# Patient Record
Sex: Female | Born: 1960 | Race: Black or African American | Hispanic: No | Smoking: Current every day smoker
Health system: Southern US, Community
[De-identification: ages and names within clinical notes are randomized; demographics above are authoritative.]

## PROBLEM LIST (undated history)

## (undated) DIAGNOSIS — M7660 Achilles tendinitis, unspecified leg: Secondary | ICD-10-CM

## (undated) DIAGNOSIS — F329 Major depressive disorder, single episode, unspecified: Secondary | ICD-10-CM

## (undated) DIAGNOSIS — B009 Herpesviral infection, unspecified: Secondary | ICD-10-CM

## (undated) DIAGNOSIS — I10 Essential (primary) hypertension: Secondary | ICD-10-CM

## (undated) DIAGNOSIS — M7580 Other shoulder lesions, unspecified shoulder: Secondary | ICD-10-CM

## (undated) DIAGNOSIS — S43006A Unspecified dislocation of unspecified shoulder joint, initial encounter: Secondary | ICD-10-CM

## (undated) DIAGNOSIS — M19019 Primary osteoarthritis, unspecified shoulder: Secondary | ICD-10-CM

## (undated) DIAGNOSIS — K219 Gastro-esophageal reflux disease without esophagitis: Secondary | ICD-10-CM

## (undated) DIAGNOSIS — M109 Gout, unspecified: Secondary | ICD-10-CM

## (undated) DIAGNOSIS — F339 Major depressive disorder, recurrent, unspecified: Secondary | ICD-10-CM

## (undated) DIAGNOSIS — E781 Pure hyperglyceridemia: Secondary | ICD-10-CM

## (undated) DIAGNOSIS — R197 Diarrhea, unspecified: Secondary | ICD-10-CM

## (undated) DIAGNOSIS — M25819 Other specified joint disorders, unspecified shoulder: Secondary | ICD-10-CM

## (undated) DIAGNOSIS — M722 Plantar fascial fibromatosis: Secondary | ICD-10-CM

## (undated) DIAGNOSIS — F3289 Other specified depressive episodes: Secondary | ICD-10-CM

## (undated) DIAGNOSIS — R195 Other fecal abnormalities: Secondary | ICD-10-CM

## (undated) DIAGNOSIS — G5600 Carpal tunnel syndrome, unspecified upper limb: Secondary | ICD-10-CM

## (undated) DIAGNOSIS — M66879 Spontaneous rupture of other tendons, unspecified ankle and foot: Secondary | ICD-10-CM

## (undated) HISTORY — DX: Primary osteoarthritis, unspecified shoulder: M19.019

## (undated) HISTORY — DX: Gout, unspecified: M10.9

## (undated) HISTORY — DX: Major depressive disorder, recurrent, unspecified: F33.9

## (undated) HISTORY — PX: ECTOPIC PREGNANCY SURGERY: SHX613

## (undated) HISTORY — DX: Carpal tunnel syndrome, unspecified upper limb: G56.00

## (undated) HISTORY — PX: OTHER SURGICAL HISTORY: SHX169

## (undated) HISTORY — DX: Plantar fascial fibromatosis: M72.2

## (undated) HISTORY — DX: Other fecal abnormalities: R19.5

## (undated) HISTORY — DX: Achilles tendinitis, unspecified leg: M76.60

## (undated) HISTORY — DX: Diarrhea, unspecified: R19.7

## (undated) HISTORY — DX: Other specified joint disorders, unspecified shoulder: M25.819

## (undated) HISTORY — DX: Unspecified dislocation of unspecified shoulder joint, initial encounter: S43.006A

## (undated) HISTORY — DX: Herpesviral infection, unspecified: B00.9

## (undated) HISTORY — DX: Gastro-esophageal reflux disease without esophagitis: K21.9

## (undated) HISTORY — DX: Spontaneous rupture of other tendons, unspecified ankle and foot: M66.879

## (undated) HISTORY — DX: Other shoulder lesions, unspecified shoulder: M75.80

## (undated) HISTORY — DX: Essential (primary) hypertension: I10

## (undated) HISTORY — DX: Major depressive disorder, single episode, unspecified: F32.9

## (undated) HISTORY — DX: Pure hyperglyceridemia: E78.1

## (undated) HISTORY — DX: Other specified depressive episodes: F32.89

---

## 1997-10-26 ENCOUNTER — Other Ambulatory Visit: Admission: RE | Admit: 1997-10-26 | Discharge: 1997-10-26 | Payer: Self-pay | Admitting: Obstetrics and Gynecology

## 1999-03-02 ENCOUNTER — Other Ambulatory Visit: Admission: RE | Admit: 1999-03-02 | Discharge: 1999-03-02 | Payer: Self-pay | Admitting: Family Medicine

## 1999-05-10 ENCOUNTER — Encounter: Admission: RE | Admit: 1999-05-10 | Discharge: 1999-05-10 | Payer: Self-pay | Admitting: Family Medicine

## 1999-05-10 ENCOUNTER — Encounter: Payer: Self-pay | Admitting: Family Medicine

## 2000-01-07 ENCOUNTER — Emergency Department (HOSPITAL_COMMUNITY): Admission: EM | Admit: 2000-01-07 | Discharge: 2000-01-07 | Payer: Self-pay | Admitting: *Deleted

## 2000-06-12 ENCOUNTER — Other Ambulatory Visit: Admission: RE | Admit: 2000-06-12 | Discharge: 2000-06-12 | Payer: Self-pay | Admitting: Family Medicine

## 2000-11-30 ENCOUNTER — Emergency Department (HOSPITAL_COMMUNITY): Admission: EM | Admit: 2000-11-30 | Discharge: 2000-12-01 | Payer: Self-pay | Admitting: *Deleted

## 2001-07-11 ENCOUNTER — Other Ambulatory Visit: Admission: RE | Admit: 2001-07-11 | Discharge: 2001-07-11 | Payer: Self-pay | Admitting: Family Medicine

## 2002-09-10 ENCOUNTER — Other Ambulatory Visit: Admission: RE | Admit: 2002-09-10 | Discharge: 2002-09-10 | Payer: Self-pay | Admitting: Family Medicine

## 2003-09-10 ENCOUNTER — Other Ambulatory Visit: Admission: RE | Admit: 2003-09-10 | Discharge: 2003-09-10 | Payer: Self-pay | Admitting: Family Medicine

## 2003-09-14 ENCOUNTER — Encounter: Admission: RE | Admit: 2003-09-14 | Discharge: 2003-09-14 | Payer: Self-pay | Admitting: Family Medicine

## 2003-10-04 ENCOUNTER — Encounter: Admission: RE | Admit: 2003-10-04 | Discharge: 2003-10-04 | Payer: Self-pay | Admitting: Family Medicine

## 2004-11-24 ENCOUNTER — Other Ambulatory Visit: Admission: RE | Admit: 2004-11-24 | Discharge: 2004-11-24 | Payer: Self-pay | Admitting: Family Medicine

## 2004-12-27 ENCOUNTER — Encounter: Admission: RE | Admit: 2004-12-27 | Discharge: 2004-12-27 | Payer: Self-pay | Admitting: Family Medicine

## 2006-01-28 ENCOUNTER — Other Ambulatory Visit: Admission: RE | Admit: 2006-01-28 | Discharge: 2006-01-28 | Payer: Self-pay | Admitting: Family Medicine

## 2006-04-23 ENCOUNTER — Encounter: Admission: RE | Admit: 2006-04-23 | Discharge: 2006-04-23 | Payer: Self-pay | Admitting: Family Medicine

## 2007-09-22 ENCOUNTER — Other Ambulatory Visit: Admission: RE | Admit: 2007-09-22 | Discharge: 2007-09-22 | Payer: Self-pay | Admitting: Family Medicine

## 2007-10-14 ENCOUNTER — Encounter: Admission: RE | Admit: 2007-10-14 | Discharge: 2007-10-14 | Payer: Self-pay | Admitting: Family Medicine

## 2010-02-12 ENCOUNTER — Encounter: Payer: Self-pay | Admitting: Obstetrics and Gynecology

## 2010-02-12 ENCOUNTER — Encounter: Payer: Self-pay | Admitting: Family Medicine

## 2011-09-26 ENCOUNTER — Encounter: Payer: Self-pay | Admitting: Gastroenterology

## 2011-10-22 ENCOUNTER — Ambulatory Visit (INDEPENDENT_AMBULATORY_CARE_PROVIDER_SITE_OTHER): Admitting: Gastroenterology

## 2011-10-22 VITALS — BP 100/70 | HR 84 | Ht 59.0 in | Wt 149.0 lb

## 2011-10-22 DIAGNOSIS — R197 Diarrhea, unspecified: Secondary | ICD-10-CM

## 2011-10-22 MED ORDER — PEG-KCL-NACL-NASULF-NA ASC-C 100 G PO SOLR: 1.0000 | Freq: Once | ORAL | Status: DC

## 2011-10-22 NOTE — Patient Instructions (Addendum)
One of your biggest health concerns is your smoking.  This increases your risk for most cancers and serious cardiovascular diseases such as strokes, heart attacks.  You should try your best to stop.  If you need assistance, please contact your PCP or Smoking Cessation Class at [facility] [phone]) or [facility] Quit-Line (1-800-QUIT-NOW). Start one imodium every morning. You will be set up for a colonoscopy for diarrhea, microscopic blood in your stool (MAC sedation at [facility]). You drink too much alcohol. This can progress to alcoholism, cirrhosis and also contributes to your loose stools.  Let us know if you are interested in referral to Behavioral Health to help quit.

## 2011-10-22 NOTE — Progress Notes (Signed)
HPI: This is a     Very pleasant 51 year old woman whom I am meeting for the first time today.  Was at PCP office recently.  LFTs this week were normal  She stopped drinking hard liquor in July, only wine.  Noticed a change in her bowels, looser than usual.  Dark at times.  Constipated at times.  Will go 3-4 times a day.  Bloating, then loose stools.  She will awaken to move her bowels.    For a few years a lot of noise from abdomen.    Increased her fiber intake.  Was drinking a pint of vodka, brandy a day for years.  Stopped for two weeks several years ago.  Recently switched to wine, about 1/2 bottle a day.  Doesn't consider herself an alcoholic.    Never told she had liver problems due to etoh.  Recent physical exam, starting lipitor.    Review of systems: Pertinent positive and negative review of systems were noted in the above HPI section. Complete review of systems was performed and was otherwise normal.    Past Medical History  Diagnosis Date  . Hypertension   . Achilles bursitis or tendinitis   . Acute gouty arthropathy   . Carpal tunnel syndrome   . Depressive disorder, not elsewhere classified   . Diarrhea   . Esophageal reflux   . Herpes simplex without mention of complication   . Major depressive disorder, recurrent episode, unspecified   . Nonspecific abnormal finding in stool contents   . Other affections of shoulder region, not elsewhere classified   . Plantar fascial fibromatosis   . Primary localized osteoarthrosis, shoulder region   . Pure hyperglyceridemia   . Nontraumatic rupture of other tendons of foot and ankle   . Closed dislocation of shoulder, unspecified site     Past Surgical History  Procedure Date  . Cesarean section   . Tonsillectomy   . Ectopic pregnancy surgery     Current Outpatient Prescriptions  Medication Sig Dispense Refill  . allopurinol (ZYLOPRIM) 300 MG tablet Take 300 mg by mouth daily.      Marland Kitchen amLODipine-benazepril (LOTREL)  10-20 MG per capsule Take 1 capsule by mouth daily.      Marland Kitchen atenolol (TENORMIN) 25 MG tablet Take 25 mg by mouth 2 (two) times daily.       . Biotin 1 MG CAPS Take by mouth. Daily      . calcium carbonate 1250 MG capsule Take 1,250 mg by mouth daily.      . colchicine (COLCRYS) 0.6 MG tablet Take 0.6 mg by mouth daily.      . fish oil-omega-3 fatty acids 1000 MG capsule Take 2 g by mouth daily.      . Ginkgo Biloba (GINKOBA PO) Take by mouth. 1 daily      . indomethacin (INDOCIN) 50 MG capsule Take 50 mg by mouth as needed.       . Multiple Vitamin (MULTIVITAMIN) capsule Take 1 capsule by mouth daily.      Marland Kitchen omeprazole (PRILOSEC) 20 MG capsule Take 20 mg by mouth daily.      . Prenatal Vit-Fe Fumarate-FA (PRENATAL MULTIVITAMIN) TABS Take 1 tablet by mouth daily.      Marland Kitchen venlafaxine XR (EFFEXOR-XR) 37.5 MG 24 hr capsule Take 37.5 mg by mouth daily.      . vitamin B-12 (CYANOCOBALAMIN) 100 MCG tablet Take 500 mcg by mouth daily.        Allergies as of  10/22/2011  . (No Known Allergies)    Family History  Problem Relation Age of Onset  . Hypertension Mother   . Diabetes Mother   . Glaucoma Mother   . Glaucoma Father   . Cancer Paternal Aunt     unknown type    History   Social History  . Marital Status: Legally Separated    Spouse Name: N/A    Number of Children: 1  . Years of Education: N/A   Occupational History  .     Social History Main Topics  . Smoking status: Current Every Day Smoker -- 0.5 packs/day for 28 years    Types: Cigarettes  . Smokeless tobacco: Never Used  . Alcohol Use: Yes     2 glasses of wine daily  . Drug Use: No  . Sexually Active: Not on file   Other Topics Concern  . Not on file   Social History Narrative  . No narrative on file       Physical Exam: BP 100/70  Pulse 84  Ht 4\' 11"  (1.499 m)  Wt 149 lb (67.586 kg)  BMI 30.09 kg/m2 Constitutional: generally well-appearing Psychiatric: alert and oriented x3 Eyes: extraocular  movements intact Mouth: oral pharynx moist, no lesions Neck: supple no lymphadenopathy Cardiovascular: heart regular rate and rhythm Lungs: clear to auscultation bilaterally Abdomen: soft, nontender, nondistended, no obvious ascites, no peritoneal signs, normal bowel sounds Extremities: no lower extremity edema bilaterally Skin: no lesions on visible extremities    Assessment and plan: 51 y.o. female with  alcohol abuse, chronic diarrhea  First I recommended that she try cut back or quit alcohol completely. Her liver tests are normal but she is drinking a bottle of wine in 2 days and up until a few months ago was drinking a pint of hard liquor a day. She may indeed be an alcoholic and she understands this. I offered behavioral health referral but she is not interested at this point. We should proceed with colonoscopy at her soonest convenience for her diarrhea. Hemoccult-positive stool. She will start a single Imodium every morning in the meantime.

## 2011-11-16 ENCOUNTER — Encounter: Payer: Self-pay | Admitting: Gastroenterology

## 2011-11-16 ENCOUNTER — Ambulatory Visit (AMBULATORY_SURGERY_CENTER): Admitting: Gastroenterology

## 2011-11-16 VITALS — BP 102/67 | HR 81 | Temp 98.2°F | Resp 28 | Ht 59.0 in | Wt 149.0 lb

## 2011-11-16 DIAGNOSIS — D126 Benign neoplasm of colon, unspecified: Secondary | ICD-10-CM

## 2011-11-16 DIAGNOSIS — R197 Diarrhea, unspecified: Secondary | ICD-10-CM

## 2011-11-16 MED ORDER — SODIUM CHLORIDE 0.9 % IV SOLN: 500.0000 mL | INTRAVENOUS | Status: DC

## 2011-11-16 NOTE — Progress Notes (Signed)
Patient did not experience any of the following events: a burn prior to discharge; a fall within the facility; wrong site/side/patient/procedure/implant event; or a hospital transfer or hospital admission upon discharge from the facility. (G8907) Patient did not have preoperative order for IV antibiotic SSI prophylaxis. (G8918)  

## 2011-11-16 NOTE — Patient Instructions (Addendum)
One of your biggest health concerns is your smoking.  This increases your risk for most cancers and serious cardiovascular diseases such as strokes, heart attacks.  You should try your best to stop.  If you need assistance, please contact your PCP or Smoking Cessation Class at [facility] [phone]) or [facility] Quit-Line (1-800-QUIT-NOW). Cutting back on daily alcohol will likely help your chronic loose stool. Discharge instructions given with verbal understanding. Handout on polyps given. Resume previous medications. YOU HAD AN ENDOSCOPIC PROCEDURE TODAY AT THE [facility]: Refer to the procedure report that was given to you for any specific questions about what was found during the examination.  If the procedure report does not answer your questions, please call your gastroenterologist to clarify.  If you requested that your care partner not be given the details of your procedure findings, then the procedure report has been included in a sealed envelope for you to review at your convenience later.  YOU SHOULD EXPECT: Some feelings of bloating in the abdomen. Passage of more gas than usual.  Walking can help get rid of the air that was put into your GI tract during the procedure and reduce the bloating. If you had a lower endoscopy (such as a colonoscopy or flexible sigmoidoscopy) you may notice spotting of blood in your stool or on the toilet paper. If you underwent a bowel prep for your procedure, then you may not have a normal bowel movement for a few days.  DIET: Your first meal following the procedure should be a light meal and then it is ok to progress to your normal diet.  A half-sandwich or bowl of soup is an example of a good first meal.  Heavy or fried foods are harder to digest and may make you feel nauseous or bloated.  Likewise meals heavy in dairy and vegetables can cause extra gas to form and this can also increase the bloating.  Drink plenty of fluids but you should  avoid alcoholic beverages for 24 hours.  ACTIVITY: Your care partner should take you home directly after the procedure.  You should plan to take it easy, moving slowly for the rest of the day.  You can resume normal activity the day after the procedure however you should NOT DRIVE or use heavy machinery for 24 hours (because of the sedation medicines used during the test).    SYMPTOMS TO REPORT IMMEDIATELY: A gastroenterologist can be reached at any hour.  During normal business hours, 8:30 AM to 5:00 PM Monday through Friday, call [phone].  After hours and on weekends, please call the GI answering service at [phone] who will take a message and have the physician on call contact you.   Following lower endoscopy (colonoscopy or flexible sigmoidoscopy):  Excessive amounts of blood in the stool  Significant tenderness or worsening of abdominal pains  Swelling of the abdomen that is new, acute  Fever of 100F or higher  FOLLOW UP: If any biopsies were taken you will be contacted by phone or by letter within the next 1-3 weeks.  Call your gastroenterologist if you have not heard about the biopsies in 3 weeks.  Our staff will call the home number listed on your records the next business day following your procedure to check on you and address any questions or concerns that you may have at that time regarding the information given to you following your procedure. This is a courtesy call and so if there is no answer at  the home number and we have not heard from you through the emergency physician on call, we will assume that you have returned to your regular daily activities without incident.  SIGNATURES/CONFIDENTIALITY: You and/or your care partner have signed paperwork which will be entered into your electronic medical record.  These signatures attest to the fact that that the information above on your After Visit Summary has been reviewed and is understood.  Full responsibility of the  confidentiality of this discharge information lies with you and/or your care-partner.

## 2011-11-16 NOTE — Op Note (Signed)
[facility] [address] [place], [address]   COLONOSCOPY PROCEDURE REPORT  PATIENT: Colleen, Mccoy  MR#: [id number] BIRTHDATE: Jun 06, 1960 , 51  yrs. old GENDER: Female ENDOSCOPIST: Rachael Fee, MD REFERRED [id number] Leonette Most, M.D. PROCEDURE DATE:  11/16/2011 PROCEDURE:   Colonoscopy with biopsy and Colonoscopy with snare polypectomy ASA CLASS:   Class III INDICATIONS:chronic loose stools. MEDICATIONS: Propofol (Diprivan) 260 mg IV and MAC sedation, administered by CRNA  DESCRIPTION OF PROCEDURE:   After the risks benefits and alternatives of the procedure were thoroughly explained, informed consent was obtained.  A digital rectal exam revealed no abnormalities of the rectum.   The LB [id number]-[id number]  endoscope was introduced through the anus and advanced to the cecum, which was identified by both the appendix and ileocecal valve. No adverse events experienced.   The quality of the prep was good, using MoviPrep  The instrument was then slowly withdrawn as the colon was fully examined.  COLON FINDINGS: One small sigmoid polyp was found (sessile, 2mm across).  This was removed and sent to pathology (cold snare).  The examination was otherwise normal.  The colon was randomly biopsied and sent to pathology.  Retroflexed views revealed no abnormalities. The time to cecum=3 minutes 08 seconds.  Withdrawal time=10 minutes 36 seconds.  The scope was withdrawn and the procedure completed. COMPLICATIONS: There were no complications.  ENDOSCOPIC IMPRESSION: One small sigmoid polyp was found, removed and sent to pathology (cold snare). The examination was otherwise normal. The colon was randomly biopsied to check for microscopic colitis  RECOMMENDATIONS: 1. If the polyp(s) removed today are proven to be adenomatous (pre-cancerous) polyps, you will need a repeat colonoscopy in 5 years.  Otherwise you should continue to follow colorectal cancer screening  guidelines for "routine risk" patients with colonoscopy in 10 years.  You will receive a letter within 1-2 weeks with the results of your biopsy as well as final recommendations.  Please call my office if you have not received a letter after 3 weeks. 2. Please start once daily imodium (as recommended in the office visit recently). Also, cutting back on alcohol will likely improve your loose stools as well.   eSigned:  Rachael Fee, MD 11/16/2011 10:30 AM      PATIENT NAME:  Colleen, Mccoy MR#: [id number]

## 2011-11-19 ENCOUNTER — Telehealth: Payer: Self-pay | Admitting: *Deleted

## 2011-11-19 NOTE — Telephone Encounter (Signed)
  Follow up Call-  Call back number 11/16/2011  Post procedure Call Back phone  # [phone]  Permission to leave phone message Yes     No answer and answering machine did not pick up to leave a message

## 2011-11-23 ENCOUNTER — Encounter: Payer: Self-pay | Admitting: Gastroenterology

## 2012-05-11 ENCOUNTER — Emergency Department (HOSPITAL_BASED_OUTPATIENT_CLINIC_OR_DEPARTMENT_OTHER)

## 2012-05-11 ENCOUNTER — Encounter (HOSPITAL_BASED_OUTPATIENT_CLINIC_OR_DEPARTMENT_OTHER): Payer: Self-pay | Admitting: Emergency Medicine

## 2012-05-11 ENCOUNTER — Emergency Department (HOSPITAL_BASED_OUTPATIENT_CLINIC_OR_DEPARTMENT_OTHER)
Admission: EM | Admit: 2012-05-11 | Discharge: 2012-05-11 | Disposition: A | Discharge status: Home / Self Care | Attending: Emergency Medicine | Admitting: Emergency Medicine

## 2012-05-11 DIAGNOSIS — E876 Hypokalemia: Secondary | ICD-10-CM

## 2012-05-11 DIAGNOSIS — Z87828 Personal history of other (healed) physical injury and trauma: Secondary | ICD-10-CM | POA: Insufficient documentation

## 2012-05-11 DIAGNOSIS — Z79899 Other long term (current) drug therapy: Secondary | ICD-10-CM | POA: Insufficient documentation

## 2012-05-11 DIAGNOSIS — F10929 Alcohol use, unspecified with intoxication, unspecified: Secondary | ICD-10-CM

## 2012-05-11 DIAGNOSIS — Y92009 Unspecified place in unspecified non-institutional (private) residence as the place of occurrence of the external cause: Secondary | ICD-10-CM | POA: Insufficient documentation

## 2012-05-11 DIAGNOSIS — M109 Gout, unspecified: Secondary | ICD-10-CM | POA: Insufficient documentation

## 2012-05-11 DIAGNOSIS — Z8619 Personal history of other infectious and parasitic diseases: Secondary | ICD-10-CM | POA: Insufficient documentation

## 2012-05-11 DIAGNOSIS — W19XXXA Unspecified fall, initial encounter: Secondary | ICD-10-CM | POA: Insufficient documentation

## 2012-05-11 DIAGNOSIS — Z862 Personal history of diseases of the blood and blood-forming organs and certain disorders involving the immune mechanism: Secondary | ICD-10-CM | POA: Insufficient documentation

## 2012-05-11 DIAGNOSIS — F17200 Nicotine dependence, unspecified, uncomplicated: Secondary | ICD-10-CM | POA: Insufficient documentation

## 2012-05-11 DIAGNOSIS — E041 Nontoxic single thyroid nodule: Secondary | ICD-10-CM

## 2012-05-11 DIAGNOSIS — F1010 Alcohol abuse, uncomplicated: Secondary | ICD-10-CM | POA: Insufficient documentation

## 2012-05-11 DIAGNOSIS — Z8639 Personal history of other endocrine, nutritional and metabolic disease: Secondary | ICD-10-CM | POA: Insufficient documentation

## 2012-05-11 DIAGNOSIS — S139XXA Sprain of joints and ligaments of unspecified parts of neck, initial encounter: Secondary | ICD-10-CM | POA: Insufficient documentation

## 2012-05-11 DIAGNOSIS — F329 Major depressive disorder, single episode, unspecified: Secondary | ICD-10-CM | POA: Insufficient documentation

## 2012-05-11 DIAGNOSIS — S161XXA Strain of muscle, fascia and tendon at neck level, initial encounter: Secondary | ICD-10-CM

## 2012-05-11 DIAGNOSIS — Z8669 Personal history of other diseases of the nervous system and sense organs: Secondary | ICD-10-CM | POA: Insufficient documentation

## 2012-05-11 DIAGNOSIS — Z8739 Personal history of other diseases of the musculoskeletal system and connective tissue: Secondary | ICD-10-CM | POA: Insufficient documentation

## 2012-05-11 DIAGNOSIS — K219 Gastro-esophageal reflux disease without esophagitis: Secondary | ICD-10-CM | POA: Insufficient documentation

## 2012-05-11 DIAGNOSIS — I10 Essential (primary) hypertension: Secondary | ICD-10-CM | POA: Insufficient documentation

## 2012-05-11 DIAGNOSIS — R55 Syncope and collapse: Secondary | ICD-10-CM

## 2012-05-11 DIAGNOSIS — Y939 Activity, unspecified: Secondary | ICD-10-CM | POA: Insufficient documentation

## 2012-05-11 LAB — CBC WITH DIFFERENTIAL/PLATELET
Basophils Absolute: 0 K/uL (ref 0.0–0.1)
Basophils Relative: 0 % (ref 0–1)
Eosinophils Absolute: 0.1 K/uL (ref 0.0–0.7)
Eosinophils Relative: 1 % (ref 0–5)
HCT: 40.1 % (ref 36.0–46.0)
Hemoglobin: 14.3 g/dL (ref 12.0–15.0)
Lymphocytes Relative: 28 % (ref 12–46)
Lymphs Abs: 2.9 K/uL (ref 0.7–4.0)
MCH: 33.3 pg (ref 26.0–34.0)
MCHC: 35.7 g/dL (ref 30.0–36.0)
MCV: 93.5 fL (ref 78.0–100.0)
Monocytes Absolute: 0.6 K/uL (ref 0.1–1.0)
Monocytes Relative: 6 % (ref 3–12)
Neutro Abs: 6.6 K/uL (ref 1.7–7.7)
Neutrophils Relative %: 65 % (ref 43–77)
Platelets: 232 10*3/uL (ref 150–400)
RBC: 4.29 MIL/uL (ref 3.87–5.11)
RDW: 14.2 % (ref 11.5–15.5)
WBC: 10.1 10*3/uL (ref 4.0–10.5)

## 2012-05-11 LAB — BASIC METABOLIC PANEL WITH GFR
CO2: 22 meq/L (ref 19–32)
Calcium: 9.5 mg/dL (ref 8.4–10.5)
Chloride: 99 meq/L (ref 96–112)
GFR calc non Af Amer: 73 mL/min — ABNORMAL LOW (ref >90)
Glucose, Bld: 113 mg/dL — ABNORMAL HIGH (ref 70–99)
Potassium: 2.1 meq/L — CL (ref 3.5–5.1)

## 2012-05-11 LAB — BASIC METABOLIC PANEL
BUN: 5 mg/dL — ABNORMAL LOW (ref 6–23)
Creatinine, Ser: 0.9 mg/dL (ref 0.50–1.10)
GFR calc Af Amer: 85 mL/min — ABNORMAL LOW (ref >90)
Sodium: 139 mEq/L (ref 135–145)

## 2012-05-11 LAB — POTASSIUM
Potassium: 2.7 meq/L — CL (ref 3.5–5.1)
Potassium: 3.6 mEq/L (ref 3.5–5.1)

## 2012-05-11 LAB — MAGNESIUM: Magnesium: 1.8 mg/dL (ref 1.5–2.5)

## 2012-05-11 LAB — ETHANOL: Alcohol, Ethyl (B): 265 mg/dL — ABNORMAL HIGH (ref 0–11)

## 2012-05-11 MED ORDER — POTASSIUM CHLORIDE 10 MEQ/100ML IV SOLN
10.0000 meq | Freq: Once | INTRAVENOUS | Status: AC
  Administered 2012-05-11: 10 meq via INTRAVENOUS
  Filled 2012-05-11 (×2): qty 100

## 2012-05-11 MED ORDER — POTASSIUM CHLORIDE ER 10 MEQ PO TBCR: 10.0000 meq | EXTENDED_RELEASE_TABLET | Freq: Two times a day (BID) | ORAL | Status: AC

## 2012-05-11 MED ORDER — POTASSIUM CHLORIDE CRYS ER 20 MEQ PO TBCR
40.0000 meq | EXTENDED_RELEASE_TABLET | Freq: Once | ORAL | Status: AC
  Administered 2012-05-11: 40 meq via ORAL
  Filled 2012-05-11: qty 2

## 2012-05-11 MED ORDER — SODIUM CHLORIDE 0.9 % IV SOLN
Freq: Once | INTRAVENOUS | Status: AC
  Administered 2012-05-11: 06:00:00 via INTRAVENOUS

## 2012-05-11 MED ORDER — ACETAMINOPHEN 325 MG PO TABS: 650.0000 mg | ORAL_TABLET | Freq: Four times a day (QID) | ORAL | Status: DC | PRN

## 2012-05-11 MED ORDER — POTASSIUM CHLORIDE 10 MEQ/100ML IV SOLN
10.0000 meq | Freq: Once | INTRAVENOUS | Status: AC
  Administered 2012-05-11: 10 meq via INTRAVENOUS

## 2012-05-11 MED ORDER — POTASSIUM CHLORIDE 10 MEQ/100ML IV SOLN
10.0000 meq | Freq: Once | INTRAVENOUS | Status: AC
  Administered 2012-05-11: 10 meq via INTRAVENOUS
  Filled 2012-05-11: qty 100

## 2012-05-11 MED ORDER — SODIUM CHLORIDE 0.9 % IV BOLUS (SEPSIS)
1000.0000 mL | Freq: Once | INTRAVENOUS | Status: AC
  Administered 2012-05-11: 1000 mL via INTRAVENOUS

## 2012-05-11 MED ORDER — MAGNESIUM SULFATE 50 % IJ SOLN
1.0000 g | Freq: Once | INTRAMUSCULAR | Status: AC
  Administered 2012-05-11: 1 g via INTRAVENOUS
  Filled 2012-05-11: qty 2

## 2012-05-11 MED ORDER — POTASSIUM CHLORIDE 10 MEQ/100ML IV SOLN: 10.0000 meq | Freq: Once | INTRAVENOUS | Status: DC

## 2012-05-11 NOTE — ED Notes (Addendum)
Second bag of kcl 10 meq infusing at 75 ml/ hr site and drsg wnl als Mag 1 gm infusing over 1 hour site and drsg WNL  Y site compatiblity checked with pharmacy ok to infuse together. .Repeat k level drawn and sent to lab . Pt tolerating po fluids well denies Nausea at this time

## 2012-05-11 NOTE — Discharge Instructions (Signed)
 Hypokalemia Hypokalemia means a low potassium level in the blood.Potassium is an electrolyte that helps regulate the amount of fluid in the body. It also stimulates muscle contraction and maintains a stable acid-base balance.Most of the body's potassium is inside of cells, and only a very small amount is in the blood. Because the amount in the blood is so small, minor changes can have big effects. PREPARATION FOR TEST Testing for potassium requires taking a blood sample taken by needle from a vein in the arm. The skin is cleaned thoroughly before the sample is drawn. There is no other special preparation needed. NORMAL VALUES Potassium levels below 3.5 mEq/L are abnormally low. Levels above 5.1 mEq/L are abnormally high. Ranges for normal findings may vary among different laboratories and hospitals. You should always check with your doctor after having lab work or other tests done to discuss the meaning of your test results and whether your values are considered within normal limits. MEANING OF TEST  Your caregiver will go over the test results with you and discuss the importance and meaning of your results, as well as treatment options and the need for additional tests, if necessary. A potassium level is frequently part of a routine medical exam. It is usually included as part of a whole panel of tests for several blood salts (such as Sodium and Chloride). It may be done as part of follow-up when a low potassium level was found in the past or other blood salts are suspected of being out of balance. A low potassium level might be suspected if you have one or more of the following:  Symptoms of weakness.  Abnormal heart rhythms.  High blood pressure and are taking medication to control this, especially water pills (diuretics).  Kidney disease that can affect your potassium level .  Diabetes requiring the use of insulin. The potassium may fall after taking insulin, especially if the diabetes  had been out of control for a while.  A condition requiring the use of cortisone-type medication or certain types of antibiotics.  Vomiting and/or diarrhea for more than a day or two.  A stomach or intestinal condition that may not permit appropriate absorption of potassium.  Fainting episodes.  Mental confusion. OBTAINING TEST RESULTS It is your responsibility to obtain your test results. Ask the lab or department performing the test when and how you will get your results.  Please contact your caregiver directly if you have not received the results within one week. At that time, ask if there is anything different or new you should be doing in relation to the results. TREATMENT Hypokalemia can be treated with potassium supplements taken by mouth and/or adjustments in your current medications. A diet high in potassium is also helpful. Foods with high potassium content are:  Peas, lentils, lima beans, nuts, and dried fruit.  Whole grain and bran cereals and breads.  Fresh fruit, vegetables (bananas, cantaloupe, grapefruit, oranges, tomatoes, honeydew melons, potatoes).  Orange and tomato juices.  Meats. If potassium supplement has been prescribed for you today or your medications have been adjusted, see your personal caregiver in time02 for a re-check. SEEK MEDICAL CARE IF:  There is a feeling of worsening weakness.  You experience repeated chest palpitations.  You are diabetic and having difficulty keeping your blood sugars in the normal range.  You are experiencing vomiting and/or diarrhea.  You are having difficulty with any of your regular medications. SEEK IMMEDIATE MEDICAL CARE IF:  You experience chest pain, shortness of  breath, or episodes of dizziness.  You have been having vomiting or diarrhea for more than 2 days.  You have a fainting episode. MAKE SURE YOU:   Understand these instructions.  Will watch your condition.  Will get help right away if you are  not doing well or get worse. Document Released: 01/08/2005 Document Revised: 04/02/2011 Document Reviewed: 12/20/2007 [facility] Patient Information 2013 [place], [place].     Syncope Syncope is a fainting spell. This means the person loses consciousness and drops to the ground. The person is generally unconscious for less than 5 minutes. The person may have some muscle twitches for up to 15 seconds before waking up and returning to normal. Syncope occurs more often in elderly people, but it can happen to anyone. While most causes of syncope are not dangerous, syncope can be a sign of a serious medical problem. It is important to seek medical care.  CAUSES  Syncope is caused by a sudden decrease in blood flow to the brain. The specific cause is often not determined. Factors that can trigger syncope include:  Taking medicines that lower blood pressure.  Sudden changes in posture, such as standing up suddenly.  Taking more medicine than prescribed.  Standing in one place for too long.  Seizure disorders.  Dehydration and excessive exposure to heat.  Low blood sugar (hypoglycemia).  Straining to have a bowel movement.  Heart disease, irregular heartbeat, or other circulatory problems.  Fear, emotional distress, seeing blood, or severe pain. SYMPTOMS  Right before fainting, you may:  Feel dizzy or lightheaded.  Feel nauseous.  See all white or all black in your field of vision.  Have cold, clammy skin. DIAGNOSIS  Your caregiver will ask about your symptoms, perform a physical exam, and perform electrocardiography (ECG) to record the electrical activity of your heart. Your caregiver may also perform other heart or blood tests to determine the cause of your syncope. TREATMENT  In most cases, no treatment is needed. Depending on the cause of your syncope, your caregiver may recommend changing or stopping some of your medicines. HOME CARE INSTRUCTIONS  Have someone stay with you  until you feel stable.  Do not drive, operate machinery, or play sports until your caregiver says it is okay.  Keep all follow-up appointments as directed by your caregiver.  Lie down right away if you start feeling like you might faint. Breathe deeply and steadily. Wait until all the symptoms have passed.  Drink enough fluids to keep your urine clear or pale yellow.  If you are taking blood pressure or heart medicine, get up slowly, taking several minutes to sit and then stand. This can reduce dizziness. SEEK IMMEDIATE MEDICAL CARE IF:   You have a severe headache.  You have unusual pain in the chest, abdomen, or back.  You are bleeding from the mouth or rectum, or you have black or tarry stool.  You have an irregular or very fast heartbeat.  You have pain with breathing.  You have repeated fainting or seizure-like jerking during an episode.  You faint when sitting or lying down.  You have confusion.  You have difficulty walking.  You have severe weakness.  You have vision problems. If you fainted, call your local emergency services (911 in U.S.). Do not drive yourself to the hospital.  MAKE SURE YOU:  Understand these instructions.  Will watch your condition.  Will get help right away if you are not doing well or get worse. Document Released: 01/08/2005 Document  Revised: 07/10/2011 Document Reviewed: 03/09/2011 [facility] Patient Information 2013 [place], [place].    Alcohol Intoxication You have alcohol intoxication when the amount of alcohol that you have consumed has impaired your ability to mentally and physically function. There are a variety of factors that contribute to the level at which alcohol intoxication can occur, such as age, gender, weight, frequency of alcohol consumption, medication use, and the presence of other medical conditions, such as diabetes, seizures, or heart conditions. The blood alcohol level test measures the concentration of alcohol in  your blood. In most states, your blood alcohol level must be lower than 80 mg/dL (9.91%) to legally drive. However, many dangerous effects of alcohol can occur at much lower levels. Alcohol directly impairs the normal chemical activity of the brain and is said to be a chemical depressant. Alcohol can cause drowsiness, stupor, respiratory failure, and coma. Other physical effects can include headache, vomiting, vomiting of blood, abdominal pain, a fast heartbeat, difficulty breathing, anxiety, and amnesia. Alcohol intoxication can also lead to dangerous and life-threatening activities, such as fighting, dangerous operation of vehicles or heavy machinery, and risky sexual behavior. Alcohol can be especially dangerous when taken with other drugs. Some of these drugs are:  Sedatives.  Painkillers.  Marijuana.  Tranquilizers.  Antihistamines.  Muscle relaxants.  Seizure medicine. Many of the effects of acute alcohol intoxication are temporary. However, repeated alcohol intoxication can lead to severe medical illnesses. If you have alcohol intoxication, you should:  Stay hydrated. Drink enough water and fluids to keep your urine clear or pale yellow. Avoid excessive caffeine because this can further lead to dehydration.  Eat a healthy diet. You may have residual nausea, headache, and loss of appetite, but it is still important that you maintain good nutrition. You can start with clear liquids.  Take nonsteroidal anti-inflammatory medications as needed for headaches, but make sure to do so with small meals. You should avoid acetaminophen  for several days after having alcohol intoxication because the combination of alcohol and acetaminophen  can be toxic to your liver. If you have frequent alcohol intoxication, ask your friends and family if they think you have a drinking problem. For further help, contact:  Your caregiver.  Alcoholics Anonymous (AA).  A drug or alcohol rehabilitation  program. SEEK MEDICAL CARE IF:   You have persistent vomiting.  You have persistent pain in any part of your body.  You do not feel better after a few days. SEEK IMMEDIATE MEDICAL CARE IF:   You become shaky or tremble when you try to stop drinking.  You shake uncontrollably (seizure).  You throw up (vomit) blood. This may be bright red or it may look like black coffee grounds.  You have blood in the stool. This may be bright red or appear as a black, tarry, bad smelling stool.  You become lightheaded or faint. ANY OF THESE SYMPTOMS MAY REPRESENT A SERIOUS PROBLEM THAT IS AN EMERGENCY. Do not wait to see if the symptoms will go away. Get medical help right away. Call your local emergency services (911 in U.S.). DO NOT drive yourself to the hospital. MAKE SURE YOU:   Understand these instructions.  Will watch your condition.  Will get help right away if you are not doing well or get worse. Document Released: 10/18/2004 Document Revised: 04/02/2011 Document Reviewed: 06/27/2009 [facility] Patient Information 2013 [place], [place].    You will need a recheck with your primary care physician in about 1 week to recheck your potassium levels.  RESOURCE GUIDE  Chronic Pain Problems: Contact [facility]  [phone] Patients need to be referred by their primary care doctor.  Insufficient Money for Medicine: Contact United Way:  call 211.   No Primary Care Doctor: - Call Health Connect  [phone] - can help you locate a primary care doctor that  accepts your insurance, provides certain services, etc. - Physician Referral Service- [phone]  Agencies that provide inexpensive medical care: - [facility]  [phone] - [facility]  [phone] - [facility]  [phone] - Women's Clinic  [phone] - Planned Parenthood  [phone] [facility]  [phone]  Medicaid-accepting [facility] Providers: - [facility]- [address]  [phone], Mon-Fri 9am-7pm, Sat 9am-1pm - [facility]- [address] [place], [address] [place]  [phone] - [facility]- [address] [place]  [phone] [facility]- [address]  [phone] - Kennieth Leech- [address] [place], [address], [phone]  Only accepts [place] patients after they have their name  applied to their card  Self Pay (no insurance) in [place]: - Sickle Cell Patients: Dr Camellia Hutchinson, [facility]  [address] [place], [phone] - [facility]- [address] [place]  [phone]       [place] [place]- [address]       -     [facility]- see information above (Speak to [employer] if you do not have insurance)       -  [facility]- [address],  [phone]       -  [facility]- [address] [phone]       -  Dr Catalina-  [address], [place], [phone]       -  [facility]gent [facility] and [facility] - [address] [phone]       -  [facility]- [address], [phone], also [address], [phone]       -    [facility]- [address] [place], [phone], 1st & 3rd Saturday        every month, 10am-1pm  [facility] [address] [place], [place] [address] [phone]  [facility] [address] [place], [place] [address] [phone]  1) Find a Doctor and Pay Out of Pocket Although you won't have to find out who is covered by your insurance plan, it is a good idea to ask around and get recommendations. You will then need to call the office and see if the doctor you have chosen will accept you as a new patient and what types of options they offer for patients who are self-pay. Some doctors offer discounts or will set up payment plans for  their patients who do not have insurance, but you will need to ask so you aren't surprised when you get to your appointment.  2) Contact Your Local Health Department Not all health departments have doctors that can see patients for sick visits, but many do, so it is worth a call to see if yours does. If you don't know where your local health department is, you can check in your phone book. The CDC also has a  tool to help you locate your state's health department, and many state websites also have listings of all of their local health departments.  3) Find a Walk-in Clinic If your illness is not likely to be very severe or complicated, you may want to try a walk in clinic. These are popping up all over the country in pharmacies, drugstores, and shopping centers. They're usually staffed by nurse practitioners or physician assistants that have been trained to treat common illnesses and complaints. They're usually fairly quick and inexpensive. However, if you have serious medical issues or chronic medical problems, these are probably not your best option  STD Testing - [facility] Department of [employer] [facility], STD Clinic, [address], [place], phone [phone] or [phone].  Monday - Friday, call for an appointment. [facility] Department of [employer], STD Clinic, [address], [place], phone [phone] or [phone].  Monday - Friday, call for an appointment.  Abuse/Neglect: [facility] Child Abuse Hotline 828-060-4954 [facility] Child Abuse Hotline [phone] (After Hours)  Emergency Shelter:  [facility] [phone]  Maternity Homes: - [facility] at [facility] 9342714494 - [facility] [phone]  MRSA Hotline #:   9057918011  Dental Assistance If unable to pay or uninsured, contact:  [facility]. to become qualified for the adult dental clinic.  Patients with  Medicaid: [facility] [phone] [address], [phone] [address] [phone]  If unable to pay, or uninsured, contact [facility] [phone] in [place], [phone] in [facility]) to become qualified for the adult dental clinic  [facility] [address] [place], [place] [address] [phone] [email or web address]  Other Proofreader Services: - [facility]- [address] [place], [place], [place], [address], [phone], Ext. [phone], 2nd and 4th Thursday of the month at 6:30am.  10 clients each day by appointment, can sometimes see walk-in patients if someone does not show for an appointment. [facility]- [address] [place], [place], [address], [phone] - [facility] [address] [place], [place], [address], [phone] - [facility]- [phone] [facility] Health Department- [phone] [facility] Health [phone]       Behavioral Health Resources in the [facility]  Intensive Outpatient Programs: [facility]      [address] [place], [place] [phone] Both a day and evening program       [facility] Outpatient     [address]        [place], [place] [address] [phone]         ADS: [facility] [address] [place] [phone]  [facility] Mental Health ACCESS LINE: [phone] or [phone] [address] [place], [place] [address] [email or web address]   Substance Abuse Resources: - [facility] Drug [facility]  [phone] - [facility] [phone] - [facility] [phone] [facility] [phone] - Residential & Outpatient Substance Abuse Program  [phone]  Psychological Services: [facility]  [phone] [facility] Services  [phone] - [facility],  [phone] [place]. [address] [place], ACCESS LINE: [phone] or [phone], [email or web address]  Mobile Crisis Teams:  [facility]rapeutic Alternatives         Mobile Crisis Care Unit [phone]             [facility] [address] [place] [phone]                                         Interventionist [address] [place] [phone]  Self-Help/Support Groups: Mental Health Assoc. of [employer] of support groups [phone] (call for more info)   Narcotics Anonymous (NA) Caring Services [address] [place] - 2 meetings at this location  Residential Treatment Programs:  [facility]      [address]        [place]       [phone]         [facility] [address], [place] [address]2881 [place], [place]  [address] [phone]  [facility]  [address] [place], [place] [address] [phone] Admissions: 8am-3pm M-F  [facility]     [address]        [place], [place] [address]       [phone]         [facility] [address] Christianna COYER [place], [place] [phone]  [facility] [address] [place], [place] [phone]  [facility] - Intensive Outpatient      [address]     [place], [place]       [phone]         [facility] (Addiction [facility]     [address] [place], [place] [phone] or [phone]  Residential Treatment Services (RTS), Medicaid [address] [place], [place] [phone]  [facility]ship [address]                                               [address] [place] [phone]  [facility] Resources: [phone]               General Therapy                                                  Mliss Rival, PhD        [address] [place], [place] [address]         [phone]   Insurance  [facility]   [address] [place], [place] [address] [phone]  [facility] [address] [place], [place] [address] [phone] Insurance/Medicaid/sponsorship through [facility]  [facility]ith and Families                                              [phone] [phone]  [place], [place] [address]    Therapy/tele-psych/case         [phone]          [facility] [address], [place]  [address]  Adolescent/group home/case management [phone]                                           Recardo Rival PhD       General therapy       Insurance   [phone]         Dr. Curry, [place], M-F [phone]  [facility]  [employer] [facility] Dept. [address]                 [address]         [address] [place] [address]  Tinnie Keenan Keenan Phone:  [phone]                                  Phone:  [phone]                   Phone:  [phone]  [facility], [phone] - [facility] - [facility]- [phone]       -     [facility] in [place], [address],             [phone], Insurance  [place] Child Abuse Hotline 740-816-3483 or [phone] (After Hours)

## 2012-05-11 NOTE — ED Provider Notes (Signed)
History     CSN: [id number]  Arrival date & time 05/11/12  0205   First MD Initiated Contact with Patient 05/11/12 0205      Chief Complaint  Patient presents with  . Fall    smells strongly of ETOH  . Neck Injury    pt ereports neck pain after fall    (Consider location/radiation/quality/duration/timing/severity/associated sxs/prior treatment) HPI Comments: Level 5 caveat due to intoxication.  Most history from daughter who drove pt to the ED.  Pt was intoxicated per daughter when she got home.  Pt admits to drinking "a lot."  Pt has h/o drinking heavily.   Pt admits to not eating anything today due to no appetite.  No N/V/D.  Pt has h/o psychiatric illness, used to be on disability after having a "breakdown" at work . She recently went back to work and reports more stress at work and is working this weekend.  Daughter heard patient fall in the kitchen and pt was unconscious on the floor.  She woke up shortly afterwards, was able to walk some, then blacked out again.  Pt denies CP, SOB, abd pain.  No back pain.  Pt was complaining of headache and posterior neck pain.  Pt's daughter allowed pt to urinate, then drove her here.  Pt has blacked out in the past due to drinking.  No h/o stroke, MI.    Patient is a 52 y.o. female presenting with fall and neck injury. The history is provided by the patient and a relative.  Fall  Neck Injury    Past Medical History  Diagnosis Date  . Hypertension   . Achilles bursitis or tendinitis   . Acute gouty arthropathy   . Carpal tunnel syndrome   . Depressive disorder, not elsewhere classified   . Diarrhea   . Esophageal reflux   . Herpes simplex without mention of complication   . Major depressive disorder, recurrent episode, unspecified   . Nonspecific abnormal finding in stool contents   . Other affections of shoulder region, not elsewhere classified   . Plantar fascial fibromatosis   . Primary localized osteoarthrosis, shoulder region    . Pure hyperglyceridemia   . Nontraumatic rupture of other tendons of foot and ankle   . Closed dislocation of shoulder, unspecified site     Past Surgical History  Procedure Laterality Date  . Cesarean section    . Tonsillectomy    . Ectopic pregnancy surgery      Family History  Problem Relation Age of Onset  . Hypertension Mother   . Diabetes Mother   . Glaucoma Mother   . Glaucoma Father   . Cancer Paternal Aunt     unknown type  . Colon cancer Neg Hx   . Esophageal cancer Neg Hx   . Stomach cancer Neg Hx   . Rectal cancer Neg Hx     History  Substance Use Topics  . Smoking status: Current Every Day Smoker -- 0.50 packs/day for 28 years    Types: Cigarettes  . Smokeless tobacco: Never Used  . Alcohol Use: Yes     Comment: 2 glasses of wine daily    OB History   Grav Para Term Preterm Abortions TAB SAB Ect Mult Living                  Review of Systems  Unable to perform ROS: Other    Allergies  Review of patient's allergies indicates no known allergies.  Home Medications  Current Outpatient Rx  Name  Route  Sig  Dispense  Refill  . allopurinol (ZYLOPRIM) 300 MG tablet   Oral   Take 300 mg by mouth daily.         Marland Kitchen amLODipine-benazepril (LOTREL) 10-20 MG per capsule   Oral   Take 1 capsule by mouth daily.         Marland Kitchen atenolol (TENORMIN) 25 MG tablet   Oral   Take 25 mg by mouth 2 (two) times daily.          . Biotin 1 MG CAPS   Oral   Take by mouth. Daily         . calcium carbonate 1250 MG capsule   Oral   Take 1,250 mg by mouth daily.         . colchicine (COLCRYS) 0.6 MG tablet   Oral   Take 0.6 mg by mouth daily.         . fish oil-omega-3 fatty acids 1000 MG capsule   Oral   Take 2 g by mouth daily.         . Ginkgo Biloba (GINKOBA PO)   Oral   Take by mouth. 1 daily         . indomethacin (INDOCIN) 50 MG capsule   Oral   Take 50 mg by mouth as needed.          . Multiple Vitamin (MULTIVITAMIN)  capsule   Oral   Take 1 capsule by mouth daily.         Marland Kitchen omeprazole (PRILOSEC) 20 MG capsule   Oral   Take 20 mg by mouth daily.         . Prenatal Vit-Fe Fumarate-FA (PRENATAL MULTIVITAMIN) TABS   Oral   Take 1 tablet by mouth daily.         Marland Kitchen venlafaxine XR (EFFEXOR-XR) 37.5 MG 24 hr capsule   Oral   Take 37.5 mg by mouth daily.         . vitamin B-12 (CYANOCOBALAMIN) 100 MCG tablet   Oral   Take 500 mcg by mouth daily.           BP 102/58  Pulse 77  Temp(Src) 97.3 F (36.3 C)  Ht 4\' 11"  (1.499 m)  Wt 140 lb (63.504 kg)  BMI 28.26 kg/m2  SpO2 95%  Physical Exam  Nursing note and vitals reviewed. Constitutional: She is oriented to person, place, and time. She appears well-developed and well-nourished.  HENT:  Head: Normocephalic and atraumatic.  Eyes: EOM are normal.  Neck: Trachea normal. No JVD present. No spinous process tenderness and no muscular tenderness present. No tracheal deviation present.  Cardiovascular: Normal rate and regular rhythm.   No murmur heard. Pulmonary/Chest: Effort normal. No stridor. No respiratory distress. She has no wheezes.  Abdominal: Soft. She exhibits no distension. There is no tenderness. There is no rebound and no guarding.  Musculoskeletal: She exhibits no tenderness.  Neurological: She is alert and oriented to person, place, and time. She has normal reflexes. She exhibits normal muscle tone. Coordination normal.  Symmetric, purposeful, weak diffusely, but I think poor effort.  Pt complains of diffuse full body numbness, but able to feel gross soft touch, sharp touch in distal extremities  Skin: Skin is warm. No rash noted.    ED Course  Procedures (including critical care time)   CRITICAL CARE Performed by: Lear Ng.   Total critical care time: 30 min Critical care time was  exclusive of separately billable procedures and treating other patients.  Critical care was necessary to treat or prevent imminent  or life-threatening deterioration.  Critical care was time spent personally by me on the following activities: development of treatment plan with patient and/or surrogate as well as nursing, discussions with consultants, evaluation of patient's response to treatment, examination of patient, obtaining history from patient or surrogate, ordering and performing treatments and interventions, ordering and review of laboratory studies, ordering and review of radiographic studies, pulse oximetry and re-evaluation of patient's condition.   Labs Reviewed  BASIC METABOLIC PANEL - Abnormal; Notable for the following:    Potassium 2.1 (*)    Glucose, Bld 113 (*)    BUN 5 (*)    GFR calc non Af Amer 73 (*)    GFR calc Af Amer 85 (*)    All other components within normal limits  ETHANOL - Abnormal; Notable for the following:    Alcohol, Ethyl (B) 265 (*)    All other components within normal limits  CBC WITH DIFFERENTIAL  MAGNESIUM   Ct Head Wo Contrast  05/11/2012  *RADIOLOGY REPORT*  Clinical Data:  Status post fall; occipital headache and posterior neck pain.  CT HEAD WITHOUT CONTRAST AND CT CERVICAL SPINE WITHOUT CONTRAST  Technique:  Multidetector CT imaging of the head and cervical spine was performed following the standard protocol without intravenous contrast.  Multiplanar CT image reconstructions of the cervical spine were also generated.  Comparison: None  CT HEAD  Findings: There is no evidence of acute infarction, mass lesion, or intra- or extra-axial hemorrhage on CT.  The posterior fossa, including the cerebellum, brainstem and fourth ventricle, is within normal limits.  The third and lateral ventricles, and basal ganglia are unremarkable in appearance.  The cerebral hemispheres are symmetric in appearance, with normal gray- white differentiation.  No mass effect or midline shift is seen.  There is no evidence of fracture; visualized osseous structures are unremarkable in appearance.  The orbits  are within normal limits. The paranasal sinuses and mastoid air cells are well-aerated.  No significant soft tissue abnormalities are seen.  IMPRESSION: No evidence of traumatic intracranial injury or fracture.  CT CERVICAL SPINE  Findings: There is no evidence of fracture or subluxation. Vertebral bodies demonstrate normal height and alignment. Intervertebral disc spaces are preserved.  Scattered anterior osteophytes are seen along the cervical and upper thoracic spine. Prevertebral soft tissues are within normal limits.  1.3 cm peripherally calcified nodule is noted in the right thyroid lobe.  The thyroid gland is otherwise grossly unremarkable in appearance.  The visualized lung apices are clear.  No significant soft tissue abnormalities are seen.  IMPRESSION:  1.  No evidence of fracture or subluxation along the cervical spine. 2.  1.3 cm peripherally calcified nodule in the right thyroid lobe. Consider further evaluation with thyroid ultrasound.  If patient is clinically hyperthyroid, consider nuclear medicine thyroid uptake and scan.   Original Report Authenticated By: Tonia Ghent, M.D.    Ct Cervical Spine Wo Contrast  05/11/2012  *RADIOLOGY REPORT*  Clinical Data:  Status post fall; occipital headache and posterior neck pain.  CT HEAD WITHOUT CONTRAST AND CT CERVICAL SPINE WITHOUT CONTRAST  Technique:  Multidetector CT imaging of the head and cervical spine was performed following the standard protocol without intravenous contrast.  Multiplanar CT image reconstructions of the cervical spine were also generated.  Comparison: None  CT HEAD  Findings: There is no evidence of acute infarction, mass lesion,  or intra- or extra-axial hemorrhage on CT.  The posterior fossa, including the cerebellum, brainstem and fourth ventricle, is within normal limits.  The third and lateral ventricles, and basal ganglia are unremarkable in appearance.  The cerebral hemispheres are symmetric in appearance, with normal gray-  white differentiation.  No mass effect or midline shift is seen.  There is no evidence of fracture; visualized osseous structures are unremarkable in appearance.  The orbits are within normal limits. The paranasal sinuses and mastoid air cells are well-aerated.  No significant soft tissue abnormalities are seen.  IMPRESSION: No evidence of traumatic intracranial injury or fracture.  CT CERVICAL SPINE  Findings: There is no evidence of fracture or subluxation. Vertebral bodies demonstrate normal height and alignment. Intervertebral disc spaces are preserved.  Scattered anterior osteophytes are seen along the cervical and upper thoracic spine. Prevertebral soft tissues are within normal limits.  1.3 cm peripherally calcified nodule is noted in the right thyroid lobe.  The thyroid gland is otherwise grossly unremarkable in appearance.  The visualized lung apices are clear.  No significant soft tissue abnormalities are seen.  IMPRESSION:  1.  No evidence of fracture or subluxation along the cervical spine. 2.  1.3 cm peripherally calcified nodule in the right thyroid lobe. Consider further evaluation with thyroid ultrasound.  If patient is clinically hyperthyroid, consider nuclear medicine thyroid uptake and scan.   Original Report Authenticated By: Tonia Ghent, M.D.      1. Alcohol intoxication   2. Hypokalemia   3. Syncope   4. Cervical strain, acute, initial encounter   5. Thyroid nodule     ra sat is 95% and I interpret to be normal  ECG at time 03:31 shows NSR at rate 68, normal axis, poor r wave progression from leads V2-V3, no ST or T wave abn's.  Borderline ECG.  No prior ECG's for comparison.  No frank ischemia on ECG.  No abn intervals or arrythmia noted, likely not cardiac cause of syncope.  4:06 AM K+ is very low at 2.1.  Could explain syncope.  ECG shows some non specific changes.  Will keep on monitor replace by orally and by IV and continue to monitor.  Renal function and other lytes  are ok.  Pt is not on diuretics.  Pt tells me she has had low K+ in the past, never found exact cause that she is aware of. In my opinion, ETOH use acting as a diuretic along with poor oral intake is likely cause.  Will replaced orally and by IV given severity of how low it is.  Doubt cause of syncope, but possible, will replace in the ED until up around 3, likely will need a total of 200 meq at least.  Will also add Mg to labs.  Pt and family updated.  Pt has no further sig neck pain, minimal, seems only muscular, improved strength in B UE's adn better effort.  Will remove ccollar.  CT's reviewed.  Thyroid nodule is likely incidental.  Pt will need further eval by PCP.      MDM  Pt likely had syncope related to excessive ETOH intake coupled with not eating today.  Pt also under more stress than usual with h/o prior psych episodes.  Will monitor, check basic labs, ECG.  Pt has no diaphoresis, CP, SOB.  Will get CT of head and c spine.  Doubt sig traumatic injury.  Pt is conversant although quietly.  Oriented, follows simple commands.  Gavin Pound. Oletta Lamas, MD 05/11/12 [phone]

## 2012-05-11 NOTE — ED Notes (Addendum)
12 lead EKG completed and given to EDP , labs drawn with iv started and labeled at bedside  Sent for processing in lab

## 2012-05-11 NOTE — ED Notes (Addendum)
K level 2.7 per lab. Dr. Oletta Lamas aware

## 2012-05-11 NOTE — ED Notes (Signed)
Dr. Oletta Lamas aware that pt's K level is 2.1.   Pt placed on cardiac monitor per EMT

## 2012-05-11 NOTE — ED Notes (Signed)
Pt requesting information for detox ETOH EDP notified and referral obtained for d/c instructions

## 2012-05-11 NOTE — ED Notes (Signed)
Daughter states  heard loud noise while at home found mother on floor who reported to her that she had fallen and had positive LOC. Mother is currently c/o neck head and jaw pain. Mother denies memory of events. Smell of ETOH noted. Report chronic Left arm pain

## 2012-05-11 NOTE — ED Notes (Signed)
EDP Ghim aware of 2.7 KCL level order for an additional 40 meq KCL po and 10 meq KCL iv obtained for a total of 120 meq po and 30 meq iv KCL.

## 2012-05-11 NOTE — ED Provider Notes (Signed)
Patient seen and evaluated by Dr.Ghim.  Patient to have potassium rechecked and discharged if improved.  Patient with repeat potassium 3.6 per Rn and discharged with Dr. Harmon Pier discharge instructions.   Hilario Quarry, MD 05/15/12 [phone]

## 2012-05-11 NOTE — ED Notes (Signed)
I helped patient to bathroom, daughter accompanied her. Patient gave urine sample which I placed at sink in room with label on. I then applied 5 lead/monitor per nurse request.

## 2014-04-23 IMAGING — CT CT HEAD W/O CM
4 of 6 series · 14 of 47 positions shown, 15 images · non-contrast
Comparison: None

CT HEAD

CLINICAL DATA: Status post fall; occipital headache and posterior
neck pain.

CT HEAD WITHOUT CONTRAST AND CT CERVICAL SPINE WITHOUT CONTRAST
TECHNIQUE: Multidetector CT imaging of the head and cervical spine
was performed following the standard protocol without intravenous
contrast.  Multiplanar CT image reconstructions of the cervical
spine were also generated.

[Series 2: head 4.8 h37s · axial · 0.48mm/px · z∈[-151,+4]mm · 3 of 32 slices shown, 4 images]
[im 1/32  brain]
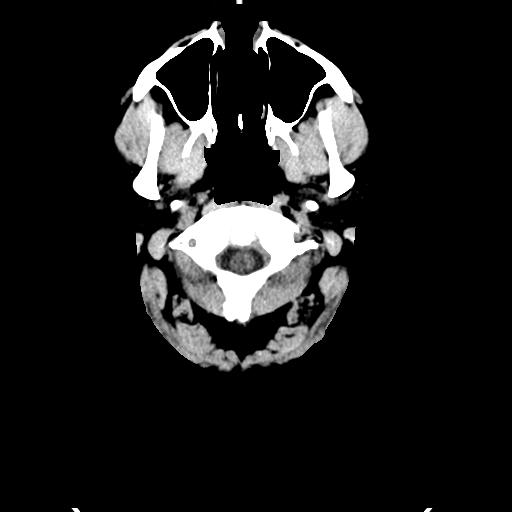
[im 1/32  bone]
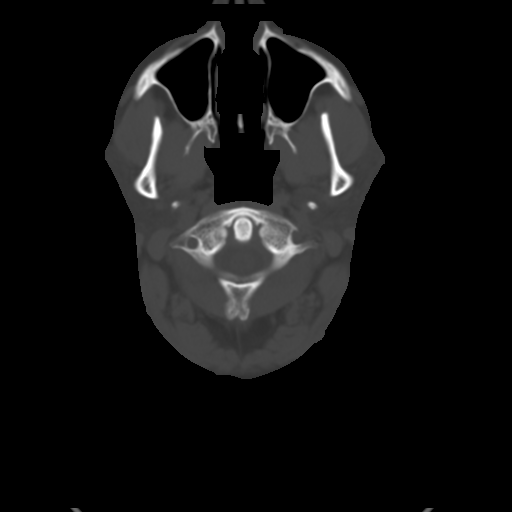
[im 16/32  brain]
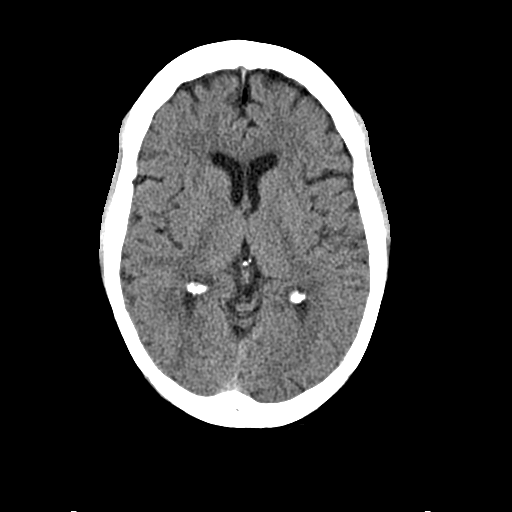
[im 32/32  brain]
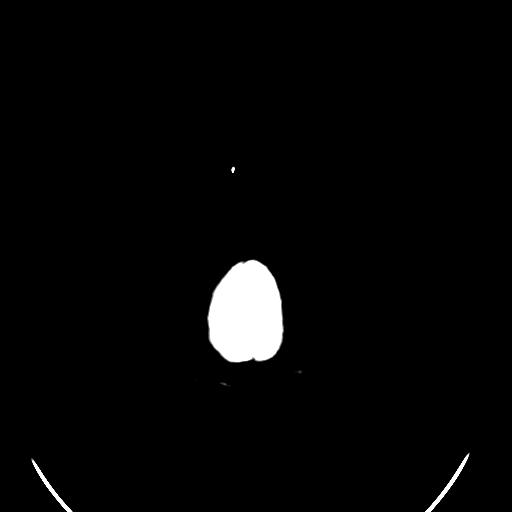

[Series 3: head 2.4 h60s bone · axial · 0.48mm/px · z∈[-127,-22]mm · 5 of 64 slices shown]
[im 11/64  bone]
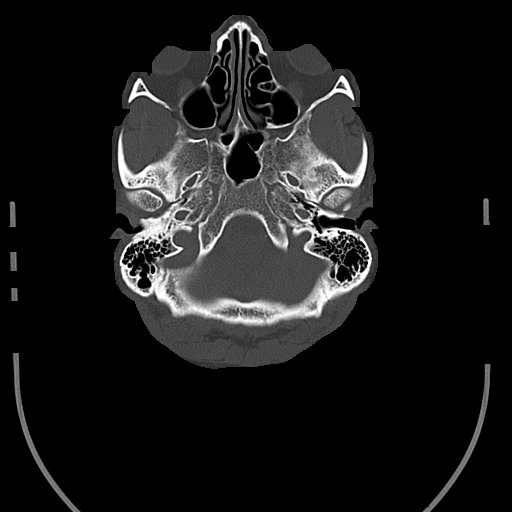
[im 22/64  bone]
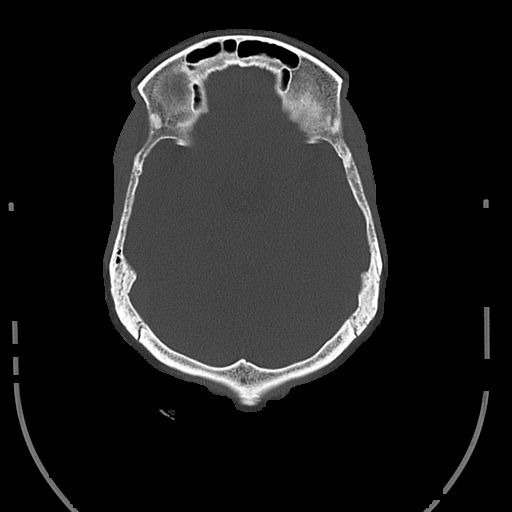
[im 32/64  bone]
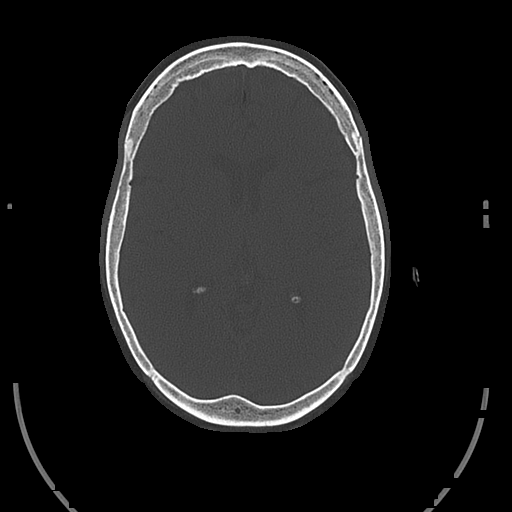
[im 43/64  bone]
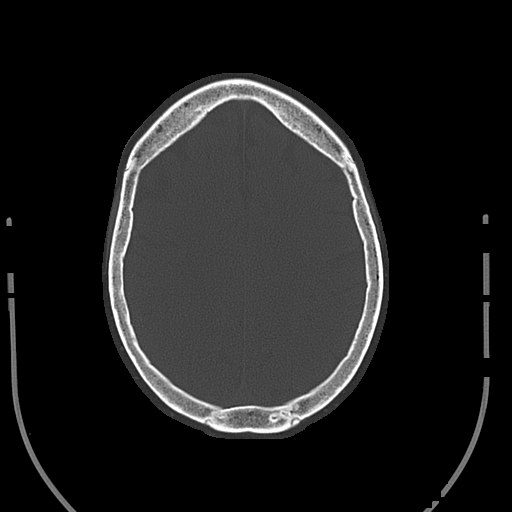
[im 53/64  bone]
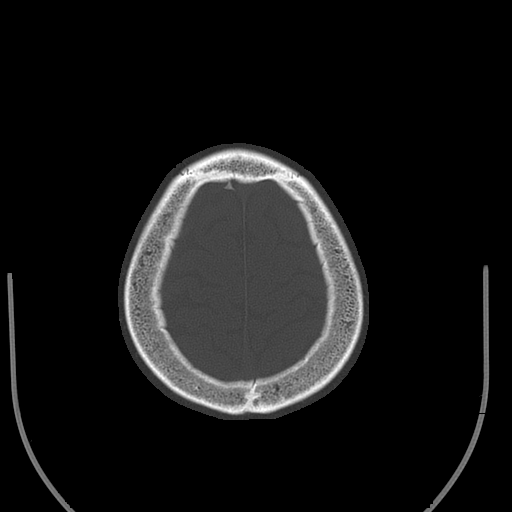

[Series 8: c_spine 2.0 coronal · coronal · 0.29mm/px · 3 of 40 slices shown]
[im 14/40  brain]
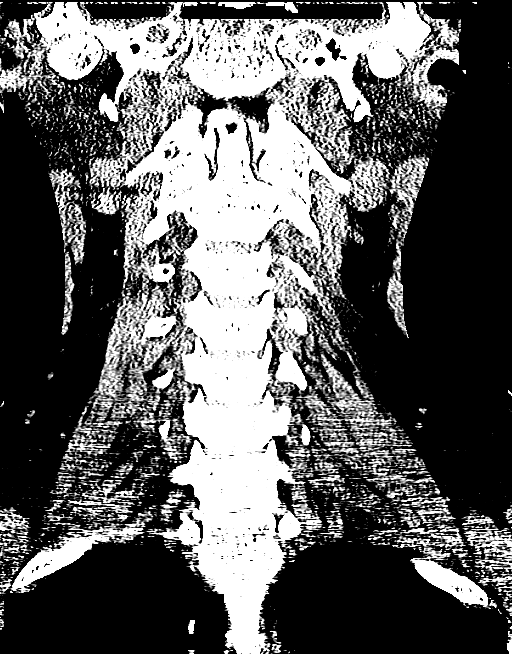
[im 18/40  brain]
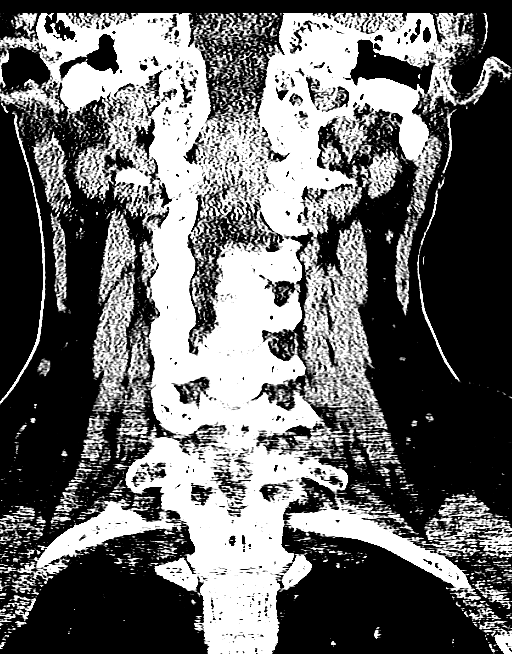
[im 22/40  brain]
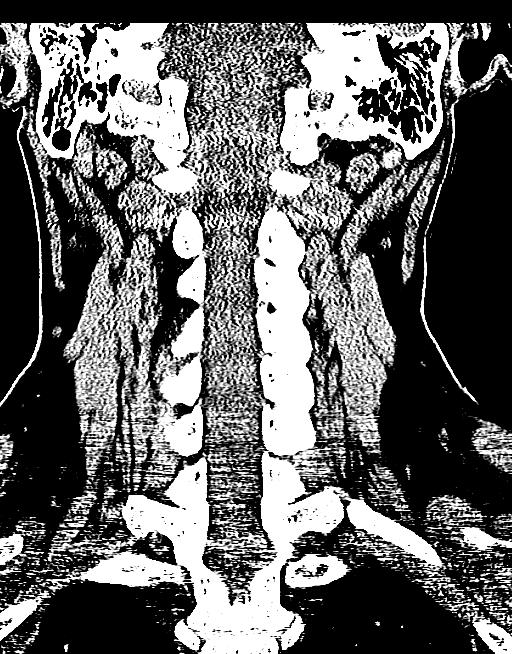

[Series 9: c_spine 2.0 sagittal · sagittal · 0.25mm/px · 3 of 42 slices shown]
[im 14/42  brain]
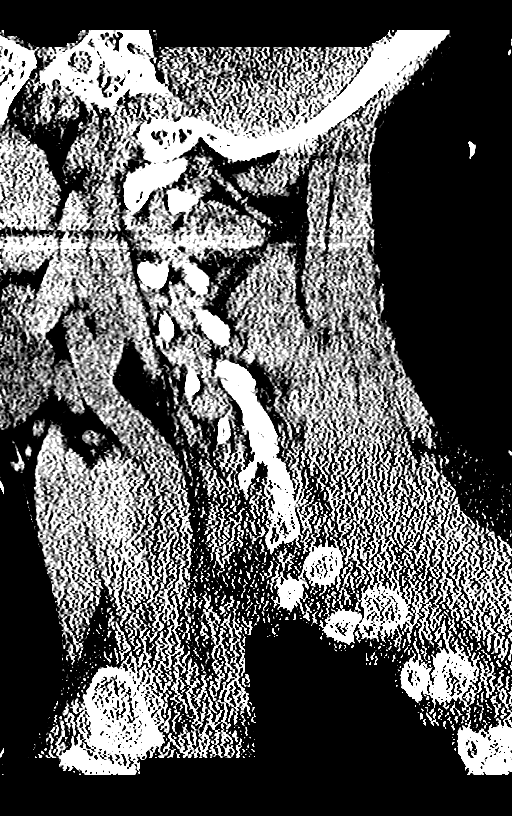
[im 21/42  brain]
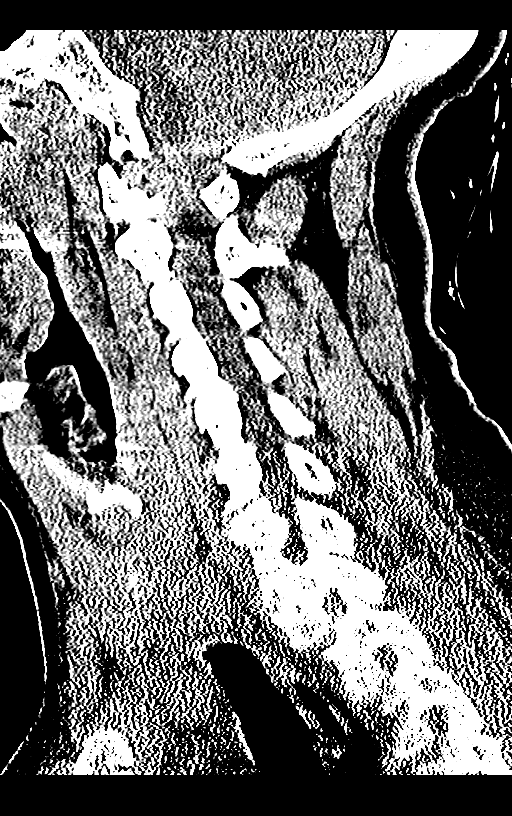
[im 28/42  brain]
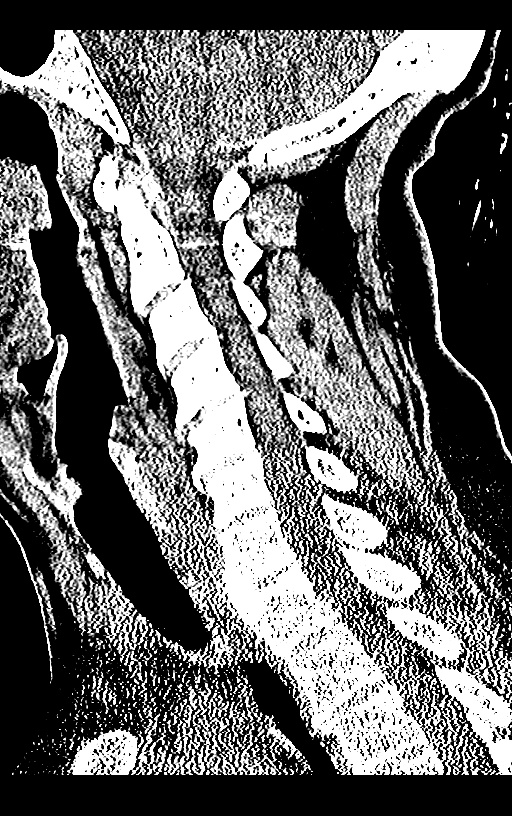

[14 of 47 positions shown; findings below may reference images not displayed]

FINDINGS: There is no evidence of acute infarction, mass lesion, or
intra- or extra-axial hemorrhage on CT.

The posterior fossa, including the cerebellum, brainstem and fourth
ventricle, is within normal limits.  The third and lateral
ventricles, and basal ganglia are unremarkable in appearance.  The
cerebral hemispheres are symmetric in appearance, with normal gray-
white differentiation.  No mass effect or midline shift is seen.

There is no evidence of fracture; visualized osseous structures are
unremarkable in appearance.  The orbits are within normal limits.
The paranasal sinuses and mastoid air cells are well-aerated.  No
significant soft tissue abnormalities are seen.
IMPRESSION: No evidence of traumatic intracranial injury or fracture.

CT CERVICAL SPINE
FINDINGS: There is no evidence of fracture or subluxation.
Vertebral bodies demonstrate normal height and alignment.
Intervertebral disc spaces are preserved.  Scattered anterior
osteophytes are seen along the cervical and upper thoracic spine.
Prevertebral soft tissues are within normal limits.

1.3 cm peripherally calcified nodule is noted in the right thyroid
lobe.  The thyroid gland is otherwise grossly unremarkable in
appearance.  The visualized lung apices are clear.  No significant
soft tissue abnormalities are seen.
IMPRESSION: 1.  No evidence of fracture or subluxation along the cervical
spine.
2.  1.3 cm peripherally calcified nodule in the right thyroid lobe.
Consider further evaluation with thyroid ultrasound.  If patient is
clinically hyperthyroid, consider nuclear medicine thyroid uptake
and scan.

## 2017-11-22 ENCOUNTER — Telehealth: Payer: Self-pay | Admitting: Family Medicine

## 2017-11-22 NOTE — Telephone Encounter (Signed)
lvm for patient about FMLA paperwork being ready and can pick up.

## 2017-11-25 DIAGNOSIS — Z029 Encounter for administrative examinations, unspecified: Secondary | ICD-10-CM

## 2021-10-19 ENCOUNTER — Encounter: Payer: Self-pay | Admitting: Gastroenterology
